# Patient Record
Sex: Female | Born: 1995 | Race: Black or African American | Hispanic: No | Marital: Single | Smoking: Current some day smoker
Health system: Southern US, Community
[De-identification: ages and names within clinical notes are randomized; demographics above are authoritative.]

## PROBLEM LIST (undated history)

## (undated) DIAGNOSIS — J45909 Unspecified asthma, uncomplicated: Secondary | ICD-10-CM

---

## 2020-04-16 ENCOUNTER — Emergency Department (HOSPITAL_COMMUNITY)
Admission: EM | Admit: 2020-04-16 | Discharge: 2020-04-16 | Disposition: A | Discharge status: Home / Self Care | Payer: Medicaid - Out of State | Attending: Emergency Medicine | Admitting: Emergency Medicine

## 2020-04-16 ENCOUNTER — Emergency Department (HOSPITAL_COMMUNITY): Payer: Medicaid - Out of State

## 2020-04-16 ENCOUNTER — Other Ambulatory Visit: Payer: Self-pay

## 2020-04-16 ENCOUNTER — Encounter (HOSPITAL_COMMUNITY): Payer: Self-pay | Admitting: *Deleted

## 2020-04-16 DIAGNOSIS — R42 Dizziness and giddiness: Secondary | ICD-10-CM | POA: Diagnosis not present

## 2020-04-16 DIAGNOSIS — R519 Headache, unspecified: Secondary | ICD-10-CM | POA: Insufficient documentation

## 2020-04-16 DIAGNOSIS — R202 Paresthesia of skin: Secondary | ICD-10-CM | POA: Insufficient documentation

## 2020-04-16 DIAGNOSIS — Z5321 Procedure and treatment not carried out due to patient leaving prior to being seen by health care provider: Secondary | ICD-10-CM | POA: Insufficient documentation

## 2020-04-16 DIAGNOSIS — H538 Other visual disturbances: Secondary | ICD-10-CM | POA: Diagnosis not present

## 2020-04-16 HISTORY — DX: Unspecified asthma, uncomplicated: J45.909

## 2020-04-16 LAB — COMPREHENSIVE METABOLIC PANEL
ALT: 18 U/L (ref 0–44)
AST: 19 U/L (ref 15–41)
Albumin: 3.2 g/dL — ABNORMAL LOW (ref 3.5–5.0)
Alkaline Phosphatase: 70 U/L (ref 38–126)
Anion gap: 11 (ref 5–15)
BUN: 17 mg/dL (ref 6–20)
CO2: 23 mmol/L (ref 22–32)
Calcium: 8.5 mg/dL — ABNORMAL LOW (ref 8.9–10.3)
Chloride: 101 mmol/L (ref 98–111)
Creatinine, Ser: 0.86 mg/dL (ref 0.44–1.00)
GFR, Estimated: >60 mL/min (ref >60)
Glucose, Bld: 98 mg/dL (ref 70–99)
Potassium: 3.9 mmol/L (ref 3.5–5.1)
Sodium: 135 mmol/L (ref 135–145)
Total Bilirubin: 0.3 mg/dL (ref 0.3–1.2)
Total Protein: 7.5 g/dL (ref 6.5–8.1)

## 2020-04-16 LAB — I-STAT CHEM 8, ED
BUN: 18 mg/dL (ref 6–20)
Calcium, Ion: 1.15 mmol/L (ref 1.15–1.40)
Chloride: 103 mmol/L (ref 98–111)
Creatinine, Ser: 0.8 mg/dL (ref 0.44–1.00)
Glucose, Bld: 93 mg/dL (ref 70–99)
HCT: 38 % (ref 36.0–46.0)
Hemoglobin: 12.9 g/dL (ref 12.0–15.0)
Potassium: 3.9 mmol/L (ref 3.5–5.1)
Sodium: 139 mmol/L (ref 135–145)
TCO2: 25 mmol/L (ref 22–32)

## 2020-04-16 LAB — DIFFERENTIAL
Abs Immature Granulocytes: 0.03 10*3/uL (ref 0.00–0.07)
Basophils Absolute: 0 10*3/uL (ref 0.0–0.1)
Basophils Relative: 0 %
Eosinophils Absolute: 0.2 10*3/uL (ref 0.0–0.5)
Eosinophils Relative: 1 %
Immature Granulocytes: 0 %
Lymphocytes Relative: 25 %
Lymphs Abs: 2.9 10*3/uL (ref 0.7–4.0)
Monocytes Absolute: 0.9 10*3/uL (ref 0.1–1.0)
Monocytes Relative: 7 %
Neutro Abs: 7.9 10*3/uL — ABNORMAL HIGH (ref 1.7–7.7)
Neutrophils Relative %: 67 %

## 2020-04-16 LAB — I-STAT BETA HCG BLOOD, ED (MC, WL, AP ONLY): I-stat hCG, quantitative: <5 m[IU]/mL (ref <5)

## 2020-04-16 LAB — CBC
HCT: 37.7 % (ref 36.0–46.0)
Hemoglobin: 11.8 g/dL — ABNORMAL LOW (ref 12.0–15.0)
MCH: 27.9 pg (ref 26.0–34.0)
MCHC: 31.3 g/dL (ref 30.0–36.0)
MCV: 89.1 fL (ref 80.0–100.0)
Platelets: 257 10*3/uL (ref 150–400)
RBC: 4.23 MIL/uL (ref 3.87–5.11)
RDW: 14.8 % (ref 11.5–15.5)
WBC: 11.8 10*3/uL — ABNORMAL HIGH (ref 4.0–10.5)
nRBC: 0 % (ref 0.0–0.2)

## 2020-04-16 NOTE — ED Notes (Signed)
Pt left AMA °

## 2020-04-16 NOTE — ED Triage Notes (Signed)
Pt says that she has had headaches for about 2-3 weeks. Feels like tension headache. Today around noon today started having some blurry vision, lightheadedness, tingling in her face and center of her lips. Has been taking motrin. Clear speech, mae x 4. Steady gait with ambulation.

## 2021-12-09 IMAGING — CT CT HEAD W/O CM
4 series · 16 of 47 positions shown, 18 images · non-contrast
Comparison: None.

CLINICAL DATA: Headaches for several weeks, blurred vision,
lightheadedness, facial tingling

EXAM:
CT HEAD WITHOUT CONTRAST
TECHNIQUE: Contiguous axial images were obtained from the base of the skull
through the vertex without intravenous contrast.

[Series 3: head wo · axial · 0.40mm/px · z∈[-109,+6]mm · 7 of 31 slices shown, 9 images]
[im 4/31  brain]
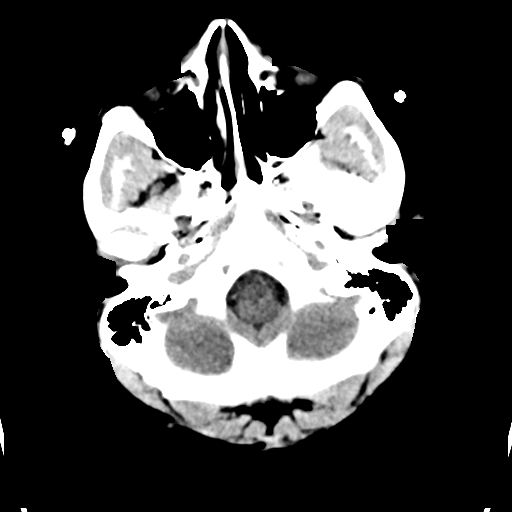
[im 4/31  bone]
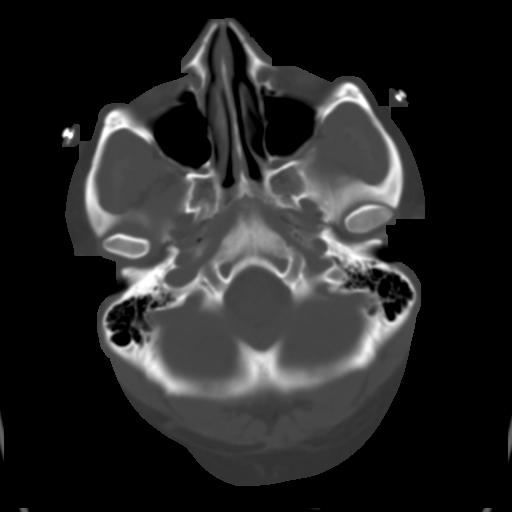
[im 8/31  brain]
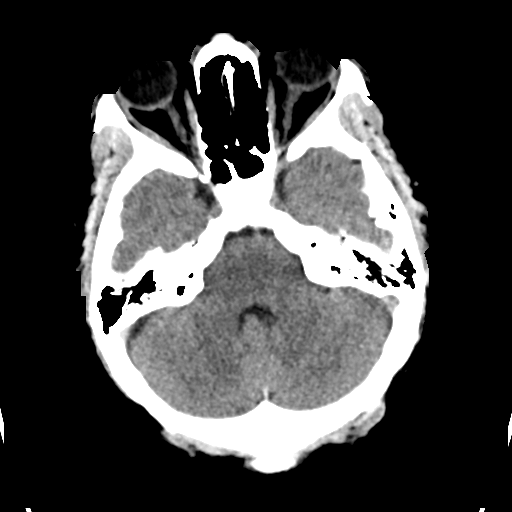
[im 12/31  brain]
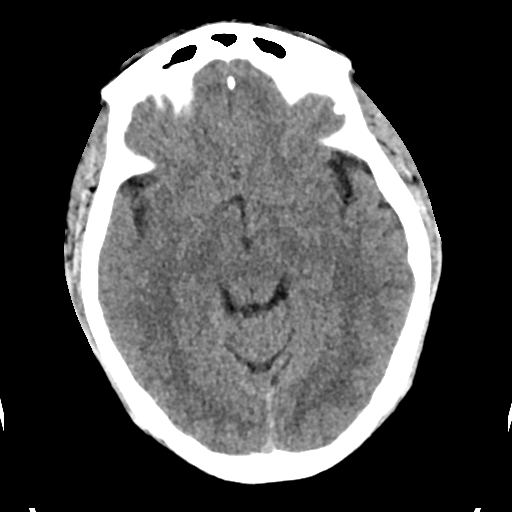
[im 16/31  brain]
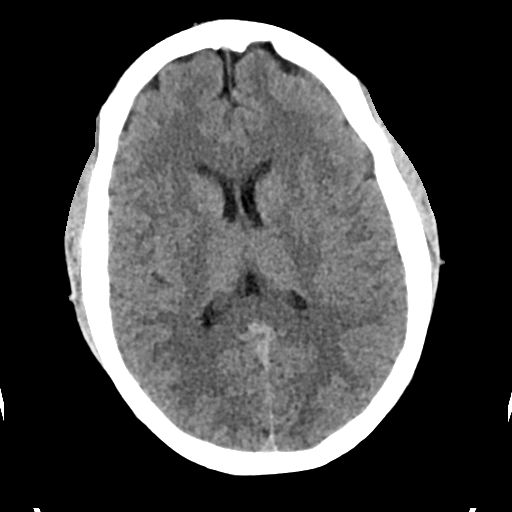
[im 19/31  brain]
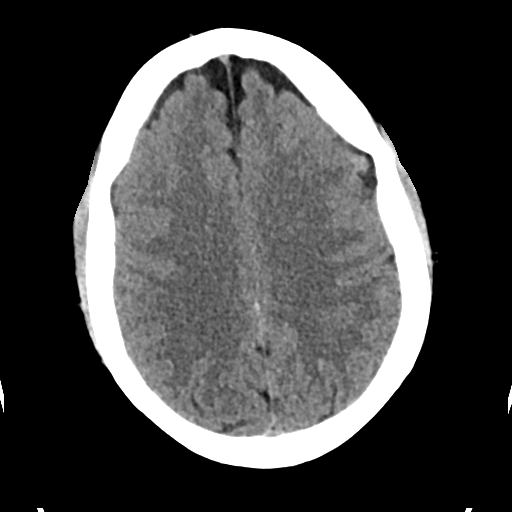
[im 19/31  bone]
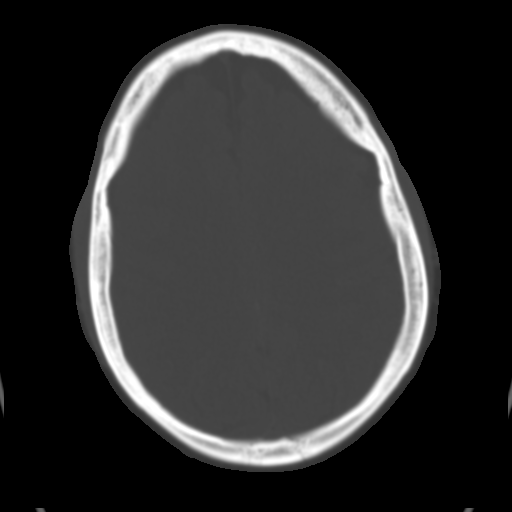
[im 23/31  brain]
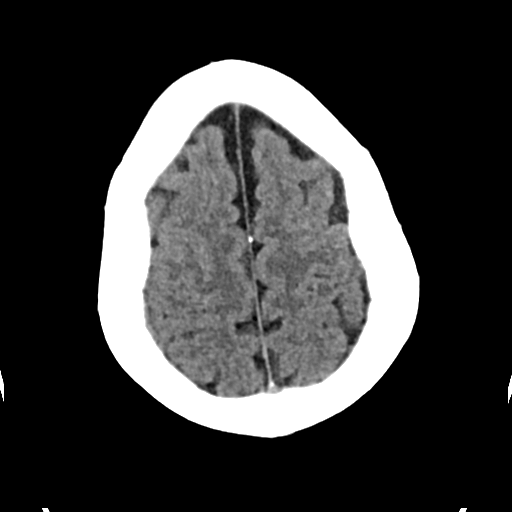
[im 27/31  brain]
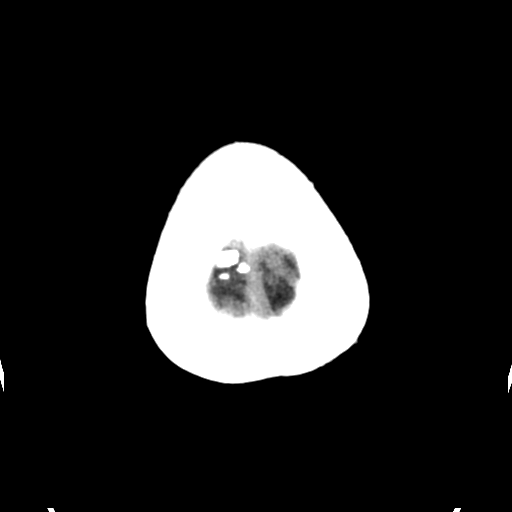

[Series 4: head bone · axial · 0.40mm/px · z∈[-110,-80]mm · 3 of 76 slices shown]
[im 8/76  bone]
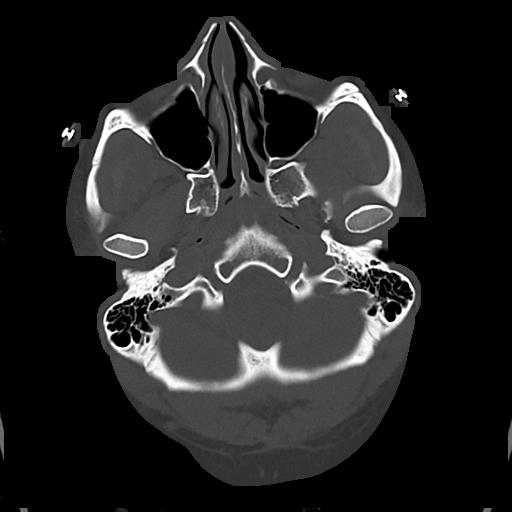
[im 16/76  bone]
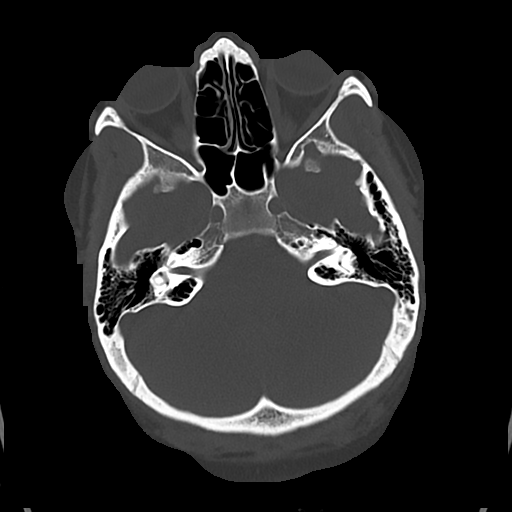
[im 23/76  bone]
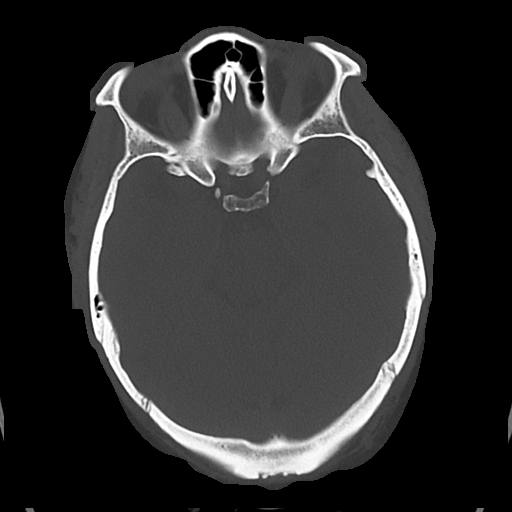

[Series 5: cor soft · coronal · 0.28mm/px · 3 of 70 slices shown]
[im 24/70  brain]
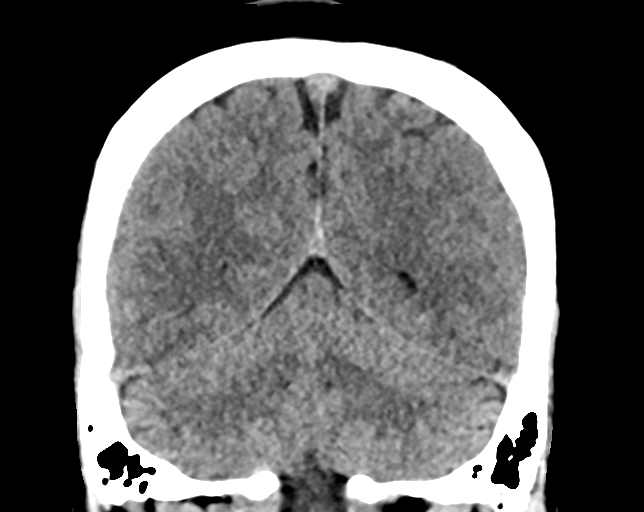
[im 31/70  brain]
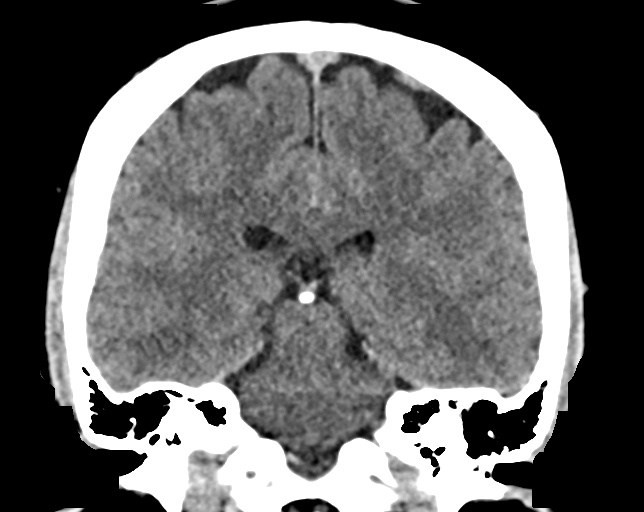
[im 39/70  brain]
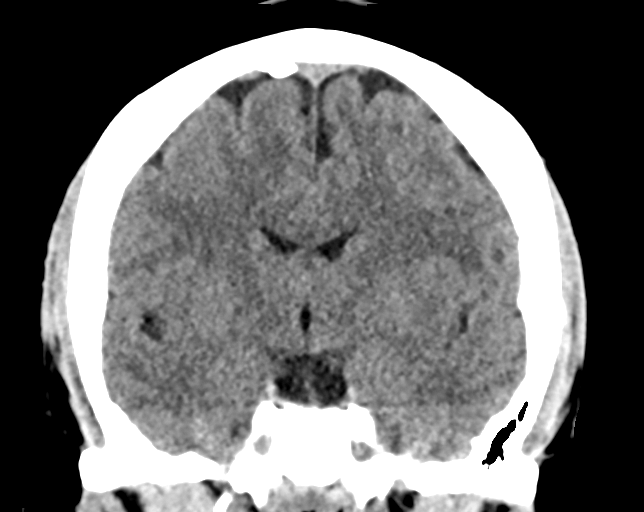

[Series 6: sag soft · sagittal · 0.28mm/px · 3 of 60 slices shown]
[im 20/60  brain]
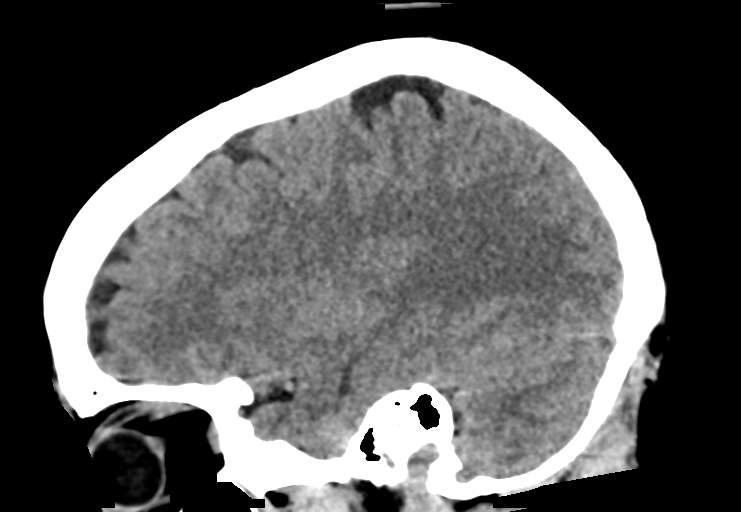
[im 30/60  brain]
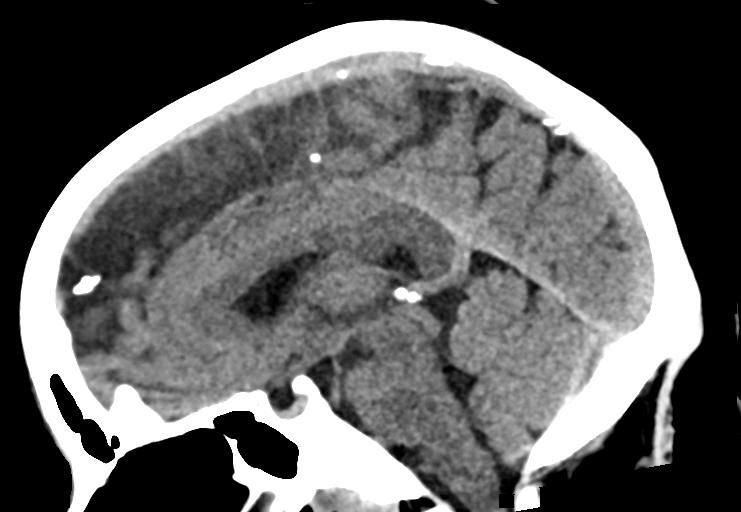
[im 40/60  brain]
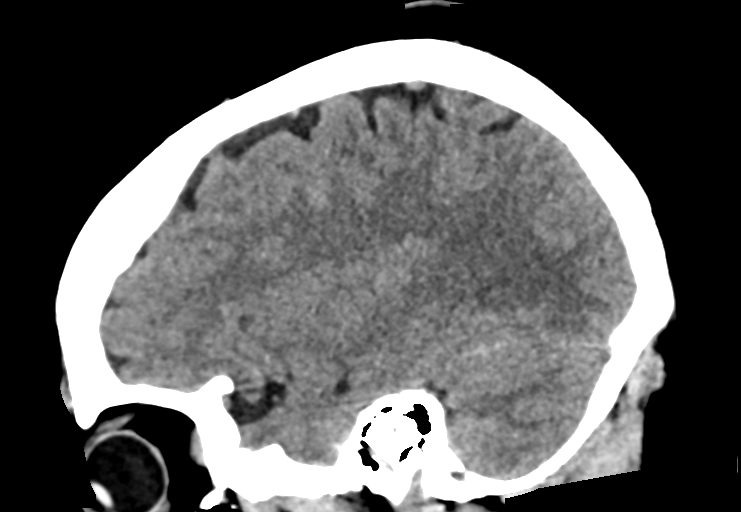

[16 of 47 positions shown; findings below may reference images not displayed]

FINDINGS: Brain: No acute infarct or hemorrhage. Lateral ventricles and
midline structures are unremarkable. No acute extra-axial fluid
collections. No mass effect.

Vascular: No hyperdense vessel or unexpected calcification.

Skull: Normal. Negative for fracture or focal lesion.

Sinuses/Orbits: No acute finding.

Other: None.
IMPRESSION: 1. No acute intracranial process.
